# Patient Record
Sex: Male | Born: 1995 | Race: White | Hispanic: No | Marital: Single
Health system: Southern US, Community
[De-identification: ages and names within clinical notes are randomized; demographics above are authoritative.]

---
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
## 2015-06-02 ENCOUNTER — Emergency Department: Payer: Self-pay

## 2015-06-02 ENCOUNTER — Emergency Department
Admission: EM | Admit: 2015-06-02 | Discharge: 2015-06-02 | Disposition: A | Discharge status: Home / Self Care | Payer: Self-pay | Attending: Emergency Medicine | Admitting: Emergency Medicine

## 2015-06-02 ENCOUNTER — Encounter: Payer: Self-pay | Admitting: *Deleted

## 2015-06-02 DIAGNOSIS — M79601 Pain in right arm: Secondary | ICD-10-CM | POA: Insufficient documentation

## 2015-06-02 MED ORDER — NAPROXEN 500 MG PO TABS: 500.0000 mg | ORAL_TABLET | Freq: Two times a day (BID) | ORAL | Status: DC

## 2015-06-02 NOTE — Discharge Instructions (Signed)
Heat Therapy °Heat therapy can help ease sore, stiff, injured, and tight muscles and joints. Heat relaxes your muscles, which may help ease your pain.  °RISKS AND COMPLICATIONS °If you have any of the following conditions, do not use heat therapy unless your health care provider has approved: °· Poor circulation. °· Healing wounds or scarred skin in the area being treated. °· Diabetes, heart disease, or high blood pressure. °· Not being able to feel (numbness) the area being treated. °· Unusual swelling of the area being treated. °· Active infections. °· Blood clots. °· Cancer. °· Inability to communicate pain. This may include young children and people who have problems with their brain function (dementia). °· Pregnancy. °Heat therapy should only be used on old, pre-existing, or long-lasting (chronic) injuries. Do not use heat therapy on new injuries unless directed by your health care provider. °HOW TO USE HEAT THERAPY °There are several different kinds of heat therapy, including: °· Moist heat pack. °· Warm water bath. °· Hot water bottle. °· Electric heating pad. °· Heated gel pack. °· Heated wrap. °· Electric heating pad. °Use the heat therapy method suggested by your health care provider. Follow your health care provider's instructions on when and how to use heat therapy. °GENERAL HEAT THERAPY RECOMMENDATIONS °· Do not sleep while using heat therapy. Only use heat therapy while you are awake. °· Your skin may turn pink while using heat therapy. Do not use heat therapy if your skin turns red. °· Do not use heat therapy if you have new pain. °· High heat or long exposure to heat can cause burns. Be careful when using heat therapy to avoid burning your skin. °· Do not use heat therapy on areas of your skin that are already irritated, such as with a rash or sunburn. °SEEK MEDICAL CARE IF: °· You have blisters, redness, swelling, or numbness. °· You have new pain. °· Your pain is worse. °MAKE SURE  YOU: °· Understand these instructions. °· Will watch your condition. °· Will get help right away if you are not doing well or get worse. °  °This information is not intended to replace advice given to you by your health care provider. Make sure you discuss any questions you have with your health care provider. °  °Document Released: 05/15/2011 Document Revised: 03/13/2014 Document Reviewed: 04/15/2013 °Elsevier Interactive Patient Education ©2016 [employer] ° °Musculoskeletal Pain °Musculoskeletal pain is muscle and boney aches and pains. These pains can occur in any part of the body. Your caregiver may treat you without knowing the cause of the pain. They may treat you if blood or urine tests, X-rays, and other tests were normal.  °CAUSES °There is often not a definite cause or reason for these pains. These pains may be caused by a type of germ (virus). The discomfort may also come from overuse. Overuse includes working out too hard when your body is not fit. Boney aches also come from weather changes. Bone is sensitive to atmospheric pressure changes. °HOME CARE INSTRUCTIONS  °· Ask when your test results will be ready. Make sure you get your test results. °· Only take over-the-counter or prescription medicines for pain, discomfort, or fever as directed by your caregiver. If you were given medications for your condition, do not drive, operate machinery or power tools, or sign legal documents for 24 hours. Do not drink alcohol. Do not take sleeping pills or other medications that may interfere with treatment. °· Continue all activities unless the activities cause   more pain. When the pain lessens, slowly resume normal activities. Gradually increase the intensity and duration of the activities or exercise. °· During periods of severe pain, bed rest may be helpful. Lay or sit in any position that is comfortable. °· Putting ice on the injured area. °¨ Put ice in a bag. °¨ Place a towel between your skin and the  bag. °¨ Leave the ice on for 15 to 20 minutes, 3 to 4 times a day. °· Follow up with your caregiver for continued problems and no reason can be found for the pain. If the pain becomes worse or does not go away, it may be necessary to repeat tests or do additional testing. Your caregiver may need to look further for a possible cause. °SEEK IMMEDIATE MEDICAL CARE IF: °· You have pain that is getting worse and is not relieved by medications. °· You develop chest pain that is associated with shortness or breath, sweating, feeling sick to your stomach (nauseous), or throw up (vomit). °· Your pain becomes localized to the abdomen. °· You develop any new symptoms that seem different or that concern you. °MAKE SURE YOU:  °· Understand these instructions. °· Will watch your condition. °· Will get help right away if you are not doing well or get worse. °  °This information is not intended to replace advice given to you by your health care provider. Make sure you discuss any questions you have with your health care provider. °  °Document Released: 02/20/2005 Document Revised: 05/15/2011 Document Reviewed: 10/25/2012 °Elsevier Interactive Patient Education ©2016 [employer] Inc. ° °

## 2015-06-02 NOTE — ED Notes (Signed)
See triage note  Right forearm tender   Positive pulses good sensation

## 2015-06-02 NOTE — ED Notes (Signed)
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

## 2015-06-02 NOTE — ED Provider Notes (Signed)
[facility] Emergency Department Provider Note  ____________________________________________  Time seen: Approximately 3:49 PM  I have reviewed the triage vital signs and the nursing notes.   HISTORY  Chief Complaint Arm Pain    HPI Brent Mann is a 20 y.o. male resents for evaluation of right arm pain 2 weeks. 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 States Tylenol is not helping his pain. Presents with a nonrelated work injuring note dated 05/20/2015 desiring a release to go back to work. Planes of continued pain with occasional numbness and tingling down the fourth and fifth finger.    History reviewed. No pertinent past medical history.  There are no active problems to display for this patient.   No past surgical history on file.  Current Outpatient Rx  Name  Route  Sig  Dispense  Refill  . naproxen (NAPROSYN) 500 MG tablet   Oral   Take 1 tablet (500 mg total) by mouth 2 (two) times daily with a meal.   60 tablet   0     Allergies Review of patient's allergies indicates no known allergies.  History reviewed. No pertinent family history.  Social History Social History  Substance Use Topics  . Smoking status: None  . Smokeless tobacco: None  . Alcohol Use: None    Review of Systems Constitutional: No fever/chills Respiratory: Denies shortness of breath. Musculoskeletal: Positive for right arm pain. Skin: Negative for rash. Neurological: Negative for headaches, focal weakness or numbness.  10-point ROS otherwise negative.  ____________________________________________   PHYSICAL EXAM:  VITAL SIGNS: ED Triage Vitals  Enc Vitals Group     BP 06/02/15 1537 121/69 mmHg     Pulse Rate 06/02/15 1537 69     Resp 06/02/15 1537 18     Temp 06/02/15 1537 97.8 F (36.6 C)     Temp Source 06/02/15 1537 Oral     SpO2 06/02/15 1537 97 %     Weight 06/02/15 1537 209 lb  (94.802 kg)     Height 06/02/15 1537 6' (1.829 m)     Head Cir --      Peak Flow --      Pain Score 06/02/15 1537 3     Pain Loc --      Pain Edu? --      Excl. in GC? --     Constitutional: Alert and oriented. Well appearing and in no acute distress. Musculoskeletal: Right arm full range of motion no obvious edema or deformity. Negative for pronation supination. Distally neurovascularly intact. Negative Tinel's. Neurologic:  Normal speech and language. No gross focal neurologic deficits are appreciated. No gait instability. Skin:  Skin is warm, dry and intact. No rash noted. Psychiatric: Mood and affect are normal. Speech and behavior are normal.  ____________________________________________   LABS (all labs ordered are listed, but only abnormal results are displayed)  Labs Reviewed - No data to display   RADIOLOGY  No Acute osseous findings noted. ____________________________________________   PROCEDURES  Procedure(s) performed: None  Critical Care performed: No  ____________________________________________   INITIAL IMPRESSION / ASSESSMENT AND PLAN / ED COURSE  Pertinent labs & imaging results that were available during my care of the patient were reviewed by me and considered in my medical decision making (see chart for details).  Right arm contusion. Reassurance provided to the patient Rx given for Naprosyn 500 mg twice a day. Patient follow-up PCP or return  to ER with any worsening symptomology. Release to go back to work tomorrow. ____________________________________________   FINAL CLINICAL IMPRESSION(S) / ED DIAGNOSES  Final diagnoses:  Arm pain, right     This chart was dictated using voice recognition software/Dragon. Despite best efforts to proofread, errors can occur which can change the meaning. Any change was purely unintentional.   Evangeline Dakinharles M Beers, PA-C 06/02/15 1731

## 2017-07-26 IMAGING — CR DG FOREARM 2V*R*
2 series · 2 of 2 positions shown · non-contrast
Comparison: None.

CLINICAL DATA: 20-year-old male with blunt trauma and pain
radiating from the wrist to the mid forearm. Initial encounter.

EXAM:
RIGHT FOREARM - 2 VIEW

[forearm ap]
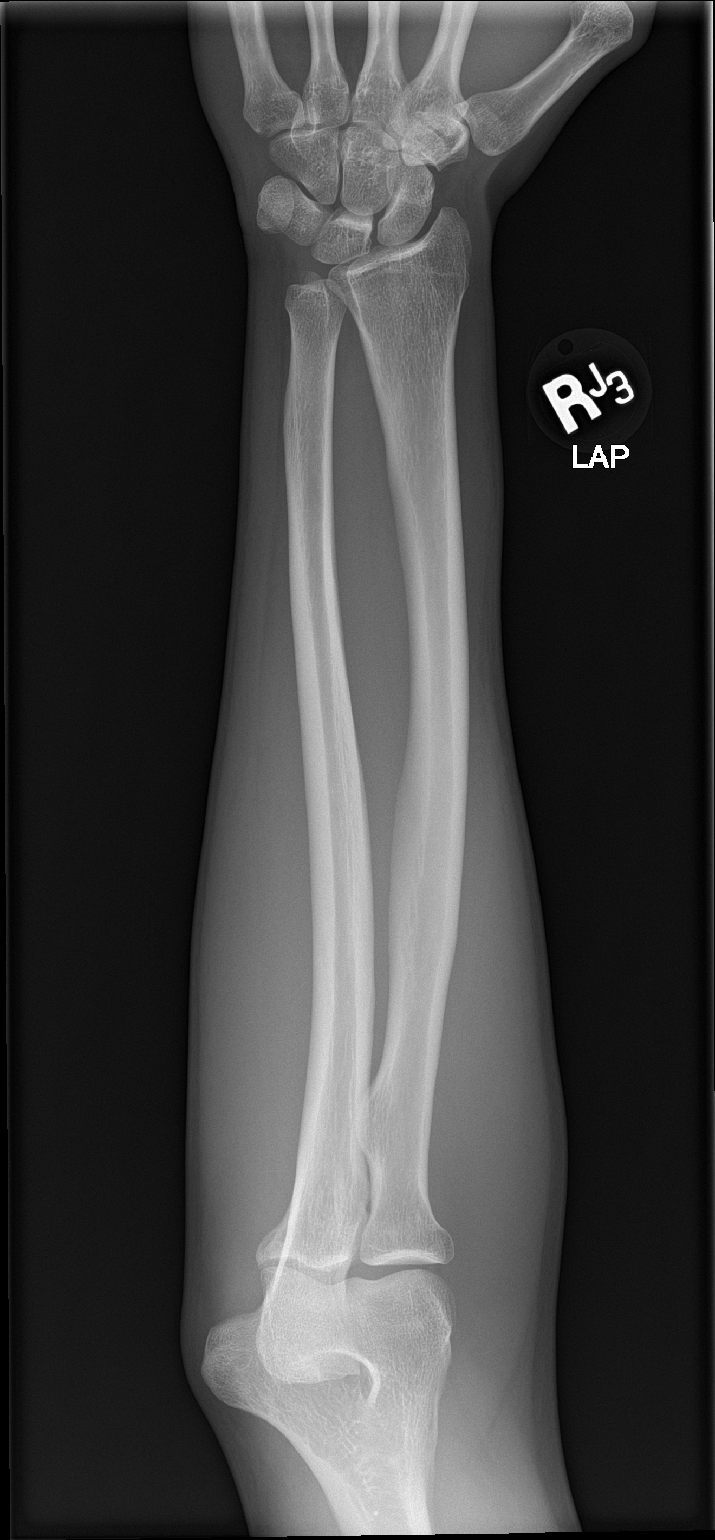

[forearm lat]
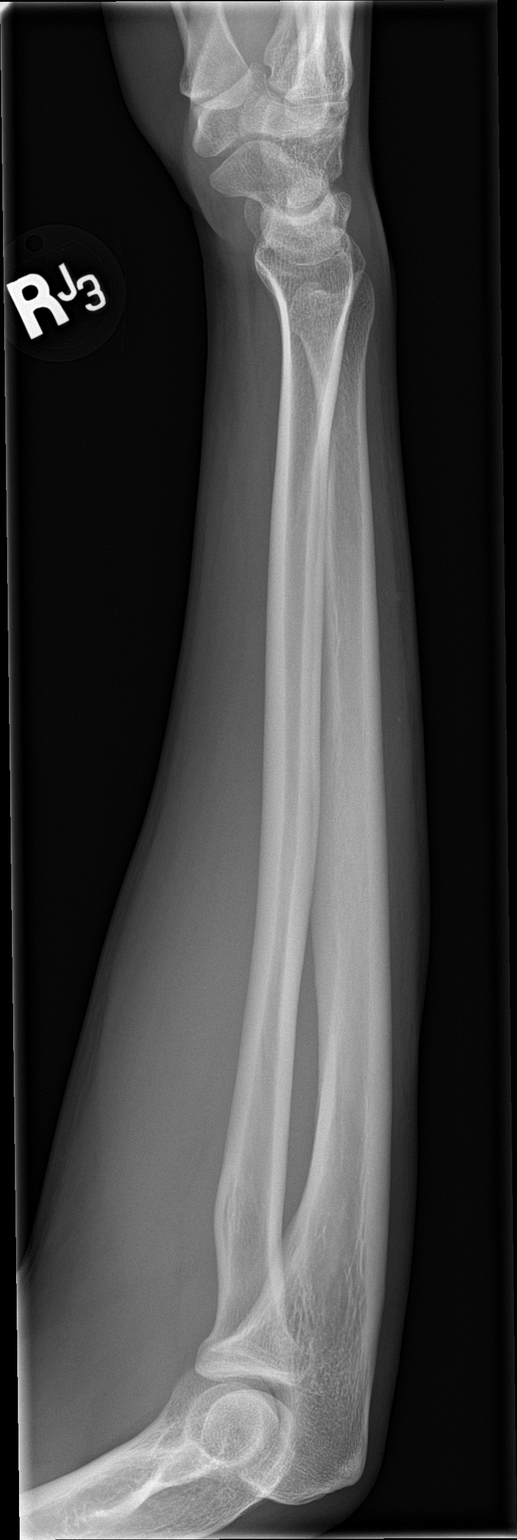

[2 of 2 positions shown; findings below may reference images not displayed]

FINDINGS: Bone mineralization is within normal limits. Ulna minus variance at
the right wrist. Carpal bone alignment appears within normal limits.
Distal radius and ulna appear intact. Mid distal radius and ulna are
intact. Alignment at the right elbow appears preserved. No soft
tissue abnormality identified.
IMPRESSION: Negative radiographic appearance of the right forearm.

## 2023-06-11 ENCOUNTER — Ambulatory Visit
Admission: EM | Admit: 2023-06-11 | Discharge: 2023-06-11 | Disposition: A | Discharge status: Home / Self Care | Attending: Emergency Medicine | Admitting: Emergency Medicine

## 2023-06-11 DIAGNOSIS — K529 Noninfective gastroenteritis and colitis, unspecified: Secondary | ICD-10-CM

## 2023-06-11 MED ORDER — ONDANSETRON 8 MG PO TBDP: ORAL_TABLET | ORAL | 0 refills | Status: AC

## 2023-06-11 NOTE — ED Triage Notes (Signed)
 Pt c/o emesis & diarrhea x2 days. States ate [employer] & sx's started after. Had multiple episodes of emesis yesterday.

## 2023-06-11 NOTE — ED Provider Notes (Signed)
  HPI  SUBJECTIVE:  Brent Mann is a 28 y.o. male who presents with 3 days of multiple episodes of nonbilious, nonbloody emesis, watery diarrhea and nausea after eating some questionable [employer].  States that he has felt warm, but has had no documented fevers.  No abdominal pain, change in urine output, anorexia.  He has been taking Tylenol and Pepto-Bismol with improvement in his symptoms.  No aggravating factors.  He states that the diarrhea has resolved as of this morning.  He has no past medical history.  PCP: None  Of note, his girlfriend and both children are here today with identical symptoms, thought to have gastroenteritis.  History reviewed. No pertinent past medical history.  History reviewed. No pertinent surgical history.  History reviewed. No pertinent family history.     No current facility-administered medications for this encounter.  Current Outpatient Medications:    ondansetron (ZOFRAN-ODT) 8 MG disintegrating tablet, 1/2- 1 tablet q 8 hr prn nausea, vomiting, Disp: 20 tablet, Rfl: 0  No Known Allergies   ROS  As noted in HPI.   Physical Exam  BP (!) 145/79 (BP Location: Left Arm)   Pulse (!) 58   Temp 98.6 F (37 C) (Oral)   Resp 16   Ht 6' (1.829 m)   Wt 78 kg   SpO2 97%   BMI 23.33 kg/m   Constitutional: Well developed, well nourished, no acute distress Eyes: PERRL, EOMI, conjunctiva normal bilaterally HENT: Normocephalic, atraumatic,mucus membranes moist Respiratory: Clear to auscultation bilaterally, no rales, no wheezing, no rhonchi Cardiovascular: Normal rate and rhythm, no murmurs, no gallops, no rubs GI: Soft, nondistended, normal bowel sounds, nontender, no rebound, no guarding skin: No rash, skin intact.  Capillary refill less than 2 seconds. Musculoskeletal:  no deformities Neurologic: Alert & oriented x 3, CN III-XII grossly intact, no motor deficits, sensation grossly intact Psychiatric: Speech and behavior appropriate   ED  Course   Medications - No data to display  No orders of the defined types were placed in this encounter.  No results found for this or any previous visit (from the past 24 hours). No results found.  ED Clinical Impression  1. Gastroenteritis      ED Assessment/Plan     Presents with a gastroenteritis/food poisoning.  Entire family has identical symptoms after eating at [facility].  There is no evidence of acute abdomen or significant dehydration.  Deferring labs.  Patient states that the symptoms are resolving.  Will send home with Zofran in case symptoms return and for future use, advised continued oral rehydration with liquid IV, may use Pepto-Bismol as needed.  Will provide primary care list for routine care and a work note for today.  Discussed MDM, treatment plan, and plan for follow-up with patient Discussed sn/sx that should prompt return to the ED. patient agrees with plan.   Meds ordered this encounter  Medications   ondansetron (ZOFRAN-ODT) 8 MG disintegrating tablet    Sig: 1/2- 1 tablet q 8 hr prn nausea, vomiting    Dispense:  20 tablet    Refill:  0      *This clinic note was created using Scientist, clinical (histocompatibility and immunogenetics). Therefore, there may be occasional mistakes despite careful proofreading. ?    Domenick Gong, MD 06/11/23 1816

## 2023-06-11 NOTE — Discharge Instructions (Signed)
 Zofran in case symptoms return and for future use, continued oral rehydration with light containing fluids such as Pedialyte, Gatorade or liquid IV, may use Pepto-Bismol as needed.  Here is a list of primary care providers who are taking new patients:  [facility] [place] Dr. Joseph Berkshire (sports medicine) Dr. Harrie Jeans Alvester Morin, PA Dr. Sampson Goon [address] [place] [address] [phone]  [facility] [address] [place], [place] [address] [phone]  [facility] [place] [address] [place] [address]  [phone]  [facility] [address] [place], [place] [address] [phone]  [facility] [address] [place]  [phone] [place], [place] [address]  Here are clinics/ other resources who will see you if you do not have insurance. Some have certain criteria that you must meet. Call them and find out what they are:  [facility]: [address], [place], [place] [address] Phone: [phone] Hours: First and Third Saturdays of each Month, 9 a.m. - 1 p.m.  [facility]: [address] [place], [place] [address] Phone: [phone] Hours: Tuesday, 4 p.m. - 8 p.m. Thursday, 1 p.m. - 8 p.m. Wednesday, 9 a.m. - [facility] [address] [place], [place] [address] Phone: [phone] Pharmacy Phone Number: [phone] Dental Phone Number: [phone] [facility] Insurance Help: [phone]  Dental Hours: Monday - Thursday, 8 a.m. - 6 p.m.  [facility] [address] [place], [place] [address] Phone: [phone] Pharmacy Phone Number: [phone] [facility] Insurance Help: [phone]  [facility] [address] [place] [place], [place] [address] Phone: [phone] Pharmacy Phone Number: [phone] [facility] Insurance Help: [phone]  [facility] [address] [place], [place]  [address] Phone: [phone] [facility] Insurance Help: [phone]   [facility] [address], [place], [place] [address] Phone: [phone]  Go to www.goodrx.com  or www.costplusdrugs.com to look up your medications. This will give you a list of where you can find your prescriptions at the most affordable prices. Or ask the pharmacist what the cash price is, or if they have any other discount programs available to help make your medication more affordable. This can be less expensive than what you would pay with insurance.
